# Patient Record
Sex: Male | Born: 1985 | Race: Black or African American | Hispanic: No | Marital: Single | Smoking: Current every day smoker
Health system: Southern US, Community
[De-identification: ages and names within clinical notes are randomized; demographics above are authoritative.]

## PROBLEM LIST (undated history)

## (undated) HISTORY — PX: KNEE ARTHROSCOPY: SUR90

---
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
## 2007-02-25 ENCOUNTER — Encounter: Payer: Self-pay | Admitting: *Deleted

## 2010-07-04 ENCOUNTER — Inpatient Hospital Stay (INDEPENDENT_AMBULATORY_CARE_PROVIDER_SITE_OTHER)
Admission: RE | Admit: 2010-07-04 | Discharge: 2010-07-04 | Disposition: A | Discharge status: Home / Self Care | Payer: BC Managed Care – PPO | Source: Ambulatory Visit | Attending: Family Medicine | Admitting: Family Medicine

## 2010-07-04 ENCOUNTER — Ambulatory Visit (INDEPENDENT_AMBULATORY_CARE_PROVIDER_SITE_OTHER): Payer: BC Managed Care – PPO

## 2010-07-04 DIAGNOSIS — M79609 Pain in unspecified limb: Secondary | ICD-10-CM

## 2010-07-21 NOTE — Op Note (Signed)
NAME:  RICHERD, GRIME                        ACCOUNT NO.:  [id number]   MEDICAL RECORD NO.:  [id number]                   PATIENT TYPE:  AMB   LOCATION:  DSC                                  FACILITY:  [facility]   PHYSICIAN:  Claude Manges. Cleophas Dunker, M.D.            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   POSTOPERATIVE DIAGNOSES:  Osteochondritis, distal ______ mediofemoral  condyle left knee.   PROCEDURE:  Drilling at mediofemoral condyle left knee.   SURGEON:  Claude Manges. Cleophas Dunker, M.D.   ASSISTANT:  Jamelle Rushing, P.A.   ANESTHESIA:  General.   COMPLICATIONS:  None.   HISTORY:  A 25 year old young man who had sudden onset of left knee pain  several months ago.  The patient was seen with pain along the medial  compartment with mild effusion.  Subsequently, he had an x-ray that revealed  an osteochondritis distal _________ lesion.  An MRI confirmed the above  without evidence of loose articular cartilage.  He continues to have some  dysfunction.  Now is to have arthroscopic evaluation.   PROCEDURE IN DETAIL:  The patient was placed on operating table under  general anesthesia.  The left lower extremity was placed in the thigh holds.  The leg was then prepped with Duraprep with thigh holds at the ankles.  Sterile draping was performed.   Marcaine with epinephrine was injected on either side of the patellar  tendon.  Three puncture sites were then made.  Diagnostic arthroscopy was  performed without evidence of effusion.  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  There was no  synovitis in the superior pouch.  There was a small medial shelf plica that  was asymptomatic.  The lateral compartment was free of meniscal pathology or  chondromalacia.  The  ACL appeared to be intact.  There is a negative  ____________.  There was an area along the mid and posterior portion of the  medial femoral condyle where the articular cartilage was a little spongy but  there were no obvious loose pieces or windows of articular cartilage _____  subchondral bone.  So I elected not to do any pinning as it was obviously  stable but I did perform multiple drill holes into this spongy area.  The  joint was then cleared of any loose material.  The two stab wounds were left  open and infiltrated 0.25% Marcaine with epinephrine.  Sterile bulky  dressing was applied followed by an Ace bandage.   PLAN:  Tylenol No.3 for pain.  Office one week.  Claude Manges. Cleophas Dunker, M.D.   PWW/MEDQ  D:  02/24/2002  T:  02/24/2002  Job:  [id number]

## 2011-08-25 ENCOUNTER — Encounter (HOSPITAL_COMMUNITY): Payer: Self-pay | Admitting: Emergency Medicine

## 2011-08-25 ENCOUNTER — Emergency Department (HOSPITAL_COMMUNITY)
Admission: EM | Admit: 2011-08-25 | Discharge: 2011-08-25 | Disposition: A | Discharge status: Home / Self Care | Payer: BC Managed Care – PPO | Attending: Emergency Medicine | Admitting: Emergency Medicine

## 2011-08-25 DIAGNOSIS — M25529 Pain in unspecified elbow: Secondary | ICD-10-CM | POA: Insufficient documentation

## 2011-08-25 DIAGNOSIS — Z23 Encounter for immunization: Secondary | ICD-10-CM | POA: Insufficient documentation

## 2011-08-25 DIAGNOSIS — IMO0002 Reserved for concepts with insufficient information to code with codable children: Secondary | ICD-10-CM | POA: Insufficient documentation

## 2011-08-25 MED ORDER — TETANUS-DIPHTH-ACELL PERTUSSIS 5-2.5-18.5 LF-MCG/0.5 IM SUSP
0.5000 mL | Freq: Once | INTRAMUSCULAR | Status: AC
  Administered 2011-08-25: 0.5 mL via INTRAMUSCULAR
  Filled 2011-08-25: qty 0.5

## 2011-08-25 MED ORDER — HYDROCODONE-ACETAMINOPHEN 5-325 MG PO TABS: 2.0000 | ORAL_TABLET | ORAL | Status: AC | PRN

## 2011-08-25 NOTE — ED Notes (Signed)
Pt. Abrasions cleaned and bacitracin applied.
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
## 2011-08-25 NOTE — ED Notes (Signed)
Pt. Has abrasion to front on neck with pain 10/10 and c/o of left elbow pain as well. CMS intact. LSB removed by Dr. Radford Pax.

## 2012-01-24 ENCOUNTER — Emergency Department (HOSPITAL_COMMUNITY)
Admission: EM | Admit: 2012-01-24 | Discharge: 2012-01-24 | Disposition: A | Discharge status: Home / Self Care | Payer: BC Managed Care – PPO | Source: Home / Self Care | Attending: Emergency Medicine | Admitting: Emergency Medicine

## 2012-01-24 ENCOUNTER — Encounter (HOSPITAL_COMMUNITY): Payer: Self-pay | Admitting: *Deleted

## 2012-01-24 DIAGNOSIS — R21 Rash and other nonspecific skin eruption: Secondary | ICD-10-CM

## 2012-01-24 MED ORDER — HYDROXYZINE HCL 25 MG PO TABS: 25.0000 mg | ORAL_TABLET | Freq: Four times a day (QID) | ORAL | Status: DC

## 2012-01-24 MED ORDER — PERMETHRIN 5 % EX CREA: TOPICAL_CREAM | CUTANEOUS | Status: DC

## 2012-01-24 NOTE — ED Notes (Signed)
Rash on both hands onset 2 weeks ago with itching.  Spread to legs, back and arms.

## 2012-01-24 NOTE — ED Provider Notes (Signed)
History     CSN: [id number]  Arrival date & time 01/24/12  1630   First MD Initiated Contact with Patient 01/24/12 1635      Chief Complaint  Patient presents with  . Rash    (Consider location/radiation/quality/duration/timing/severity/associated sxs/prior treatment) HPI Comments: Patient presents urgent care complaining of rash on both of his hands that had been going on for about 2 weeks it itches the most at night. It has been spreading to both back arms and legs he was reporting his girlfriend to have a similar rash on her abdomen"  Patient is a 26 y.o. male presenting with rash. The history is provided by the patient.  Rash  This is a new problem. The current episode started more than 1 week ago. The problem has not changed since onset.The problem is associated with nothing. There has been no fever. The rash is present on the trunk (Hands). The pain is at a severity of 2/10. Associated symptoms include itching. Pertinent negatives include no weeping. He has tried nothing for the symptoms. The treatment provided no relief.    History reviewed. No pertinent past medical history.  Past Surgical History  Procedure Date  . Knee arthroscopy     Family History  Problem Relation Age of Onset  . Diabetes Mother   . Hypertension Father     History  Substance Use Topics  . Smoking status: Current Every Day Smoker -- 0.2 packs/day    Types: Cigarettes  . Smokeless tobacco: Not on file  . Alcohol Use: No     Comment: opccasional      Review of Systems  Skin: Positive for itching and rash.    Allergies  Review of patient's allergies indicates no known allergies.  Home Medications   Current Outpatient Rx  Name  Route  Sig  Dispense  Refill  . HYDROXYZINE HCL 25 MG PO TABS   Oral   Take 1 tablet (25 mg total) by mouth every 6 (six) hours.   12 tablet   0   . PERMETHRIN 5 % EX CREA      Apply from head to toes leave on for about 10 hours. Repeat treatment IN 10  days   60 g   0     BP 107/66  Pulse 92  Temp 98.6 F (37 C) (Oral)  Resp 20  SpO2 100%  Physical Exam  Nursing note and vitals reviewed. Constitutional: Vital signs are normal. He appears well-developed and well-nourished.  Non-toxic appearance. He does not have a sickly appearance. He does not appear ill. No distress.  Neurological: He is alert.  Skin: Rash noted. No bruising, no ecchymosis, no laceration and no petechiae noted. Rash is papular. Rash is not macular and not maculopapular. There is erythema.       ED Course  Procedures (including critical care time)   Labs Reviewed  RPR   No results found.   1. Diffuse papular eruption    Suspicious for scabies. Positive close contact with similar pruritic rash   MDM  Papular abruption to both hands and interdigital spaces some of the palmar surface in the anterior abdomen predominantly reporting a girlfriend with a similar rash in her abdomen pruritic and with "bumps". Most suspicious for scabies. Will perform an RPR to rule out secondary syphilis as patient has some eruptions in different stages of healing on the palmar surface of his hands.        Jimmie Molly, MD 01/24/12 [phone]

## 2012-01-25 LAB — RPR: RPR Ser Ql: NONREACTIVE

## 2012-02-15 ENCOUNTER — Encounter (HOSPITAL_COMMUNITY): Payer: Self-pay | Admitting: *Deleted

## 2012-02-15 ENCOUNTER — Other Ambulatory Visit (HOSPITAL_COMMUNITY)
Admission: RE | Admit: 2012-02-15 | Discharge: 2012-02-15 | Disposition: A | Discharge status: Home / Self Care | Payer: BC Managed Care – PPO | Source: Ambulatory Visit | Attending: Family Medicine | Admitting: Family Medicine

## 2012-02-15 ENCOUNTER — Emergency Department (HOSPITAL_COMMUNITY)
Admission: EM | Admit: 2012-02-15 | Discharge: 2012-02-15 | Disposition: A | Discharge status: Home / Self Care | Payer: BC Managed Care – PPO | Source: Home / Self Care | Attending: Family Medicine | Admitting: Family Medicine

## 2012-02-15 DIAGNOSIS — Z113 Encounter for screening for infections with a predominantly sexual mode of transmission: Secondary | ICD-10-CM | POA: Insufficient documentation

## 2012-02-15 DIAGNOSIS — A64 Unspecified sexually transmitted disease: Secondary | ICD-10-CM

## 2012-02-15 MED ORDER — AZITHROMYCIN 250 MG PO TABS
1000.0000 mg | ORAL_TABLET | Freq: Once | ORAL | Status: AC
  Administered 2012-02-15: 1000 mg via ORAL

## 2012-02-15 MED ORDER — CEFTRIAXONE SODIUM 1 G IJ SOLR
250.0000 mg | Freq: Once | INTRAMUSCULAR | Status: AC
  Administered 2012-02-15: 250 mg via INTRAMUSCULAR

## 2012-02-15 MED ORDER — AZITHROMYCIN 250 MG PO TABS
ORAL_TABLET | ORAL | Status: AC
  Filled 2012-02-15: qty 4

## 2012-02-15 MED ORDER — CEFTRIAXONE SODIUM 250 MG IJ SOLR
INTRAMUSCULAR | Status: AC
  Filled 2012-02-15: qty 250

## 2012-02-15 NOTE — ED Notes (Signed)
Pt  Reports  Symptoms  Of  Penile  Discharge   X  sev  Days  With  Burning  Sensation

## 2012-02-15 NOTE — ED Provider Notes (Signed)
History     CSN: [id number]  Arrival date & time 02/15/12  1230   First MD Initiated Contact with Patient 02/15/12 1246      Chief Complaint  Patient presents with  . SEXUALLY TRANSMITTED DISEASE    (Consider location/radiation/quality/duration/timing/severity/associated sxs/prior treatment) Patient is a 26 y.o. male presenting with penile discharge. The history is provided by the patient.  Penile Discharge This is a new problem. The current episode started yesterday. The problem has been gradually worsening. Pertinent negatives include no abdominal pain.    History reviewed. No pertinent past medical history.  Past Surgical History  Procedure Date  . Knee arthroscopy     Family History  Problem Relation Age of Onset  . Diabetes Mother   . Hypertension Father     History  Substance Use Topics  . Smoking status: Current Every Day Smoker -- 0.2 packs/day    Types: Cigarettes  . Smokeless tobacco: Not on file  . Alcohol Use: No     Comment: opccasional      Review of Systems  Constitutional: Negative.   Gastrointestinal: Negative for abdominal pain.  Genitourinary: Positive for discharge. Negative for dysuria, penile swelling and penile pain.    Allergies  Review of patient's allergies indicates no known allergies.  Home Medications   Current Outpatient Rx  Name  Route  Sig  Dispense  Refill  . HYDROXYZINE HCL 25 MG PO TABS   Oral   Take 1 tablet (25 mg total) by mouth every 6 (six) hours.   12 tablet   0   . PERMETHRIN 5 % EX CREA      Apply from head to toes leave on for about 10 hours. Repeat treatment IN 10 days   60 g   0     BP 124/52  Pulse 76  Temp 98.4 F (36.9 C) (Oral)  Resp 16  SpO2 100%  Physical Exam  Nursing note and vitals reviewed. Constitutional: He is oriented to person, place, and time. He appears well-developed and well-nourished.  Abdominal: Soft. Bowel sounds are normal. He exhibits no distension and no mass. There  is no tenderness. There is no rebound and no guarding.  Genitourinary: No penile tenderness.  Neurological: He is alert and oriented to person, place, and time.  Skin: Skin is warm and dry.    ED Course  Procedures (including critical care time)   Labs Reviewed  URINE CYTOLOGY ANCILLARY ONLY   No results found.   1. STD (male)       MDM          Linna Hoff, MD 02/15/12 1318

## 2012-02-19 ENCOUNTER — Telehealth (HOSPITAL_COMMUNITY): Payer: Self-pay | Admitting: *Deleted

## 2012-02-19 NOTE — ED Notes (Signed)
GC pos., Chlamydia neg.  Pt. adequately treated with Rocephin and Zithromax.  I called and left pt. a message to call.  [facility] form completed and faxed to the [facility]. Vassie Moselle 02/19/2012

## 2012-02-22 ENCOUNTER — Telehealth (HOSPITAL_COMMUNITY): Payer: Self-pay | Admitting: *Deleted

## 2012-04-13 ENCOUNTER — Telehealth (HOSPITAL_COMMUNITY): Payer: Self-pay | Admitting: *Deleted

## 2012-04-13 NOTE — ED Notes (Signed)
1/2 Left message.  Call 3.  Unable to contact pt. by phone.  Letter sent. Juan Bauer 04/13/2012

## 2012-09-24 ENCOUNTER — Encounter (HOSPITAL_COMMUNITY): Payer: Self-pay | Admitting: Emergency Medicine

## 2012-09-24 ENCOUNTER — Emergency Department (HOSPITAL_COMMUNITY)
Admission: EM | Admit: 2012-09-24 | Discharge: 2012-09-24 | Disposition: A | Discharge status: Home / Self Care | Payer: BC Managed Care – PPO | Attending: Emergency Medicine | Admitting: Emergency Medicine

## 2012-09-24 ENCOUNTER — Emergency Department (HOSPITAL_COMMUNITY): Payer: BC Managed Care – PPO

## 2012-09-24 DIAGNOSIS — S62308A Unspecified fracture of other metacarpal bone, initial encounter for closed fracture: Secondary | ICD-10-CM

## 2012-09-24 DIAGNOSIS — Y939 Activity, unspecified: Secondary | ICD-10-CM | POA: Insufficient documentation

## 2012-09-24 DIAGNOSIS — Y929 Unspecified place or not applicable: Secondary | ICD-10-CM | POA: Insufficient documentation

## 2012-09-24 DIAGNOSIS — F17200 Nicotine dependence, unspecified, uncomplicated: Secondary | ICD-10-CM | POA: Insufficient documentation

## 2012-09-24 DIAGNOSIS — S62319A Displaced fracture of base of unspecified metacarpal bone, initial encounter for closed fracture: Secondary | ICD-10-CM | POA: Insufficient documentation

## 2012-09-24 MED ORDER — IBUPROFEN 400 MG PO TABS
400.0000 mg | ORAL_TABLET | Freq: Once | ORAL | Status: AC
  Administered 2012-09-24: 400 mg via ORAL
  Filled 2012-09-24: qty 1

## 2012-09-24 MED ORDER — PROMETHAZINE HCL 25 MG PO TABS: 25.0000 mg | ORAL_TABLET | Freq: Four times a day (QID) | ORAL | Status: DC | PRN

## 2012-09-24 MED ORDER — HYDROCODONE-ACETAMINOPHEN 5-325 MG PO TABS: 2.0000 | ORAL_TABLET | Freq: Four times a day (QID) | ORAL | Status: DC | PRN

## 2012-09-24 NOTE — ED Notes (Signed)
Ortho at bedside.

## 2012-09-24 NOTE — ED Provider Notes (Signed)
History    CSN: [id number] Arrival date & time 09/24/12  0902  First MD Initiated Contact with Patient 09/24/12 [phone]     Chief Complaint  Patient presents with  . Hand Injury    Right hand   (Consider location/radiation/quality/duration/timing/severity/associated sxs/prior Treatment) HPI Comments: Patient is a 27 year old male who presents today with hand pain to his right hand. He got his hand caught in a door and a [place]ena[place] Niece and it crushed his hand. He has iced it since last night which helped with the pain and swelling. He describes the pain as a pressure. He can localize the pain over his 4th metacarpal. It is worse with movement. No numbness, tingling, weakness. He is right-handed. He has never injured this hand in the past.  Patient is a 27 y.o. male presenting with hand injury. The history is provided by the patient. No language interpreter was used.  Hand Injury Associated symptoms: no fever    History reviewed. No pertinent past medical history. Past Surgical History  Procedure Laterality Date  . Knee arthroscopy     Family History  Problem Relation Age of Onset  . Diabetes Mother   . Hypertension Father    History  Substance Use Topics  . Smoking status: Current Every Day Smoker -- 0.20 packs/day    Types: Cigarettes  . Smokeless tobacco: Not on file  . Alcohol Use: No     Comment: opccasional    Review of Systems  Constitutional: Negative for fever and chills.  Gastrointestinal: Negative for nausea, vomiting and abdominal pain.  Musculoskeletal: Positive for joint swelling and arthralgias.  All other systems reviewed and are negative.    Allergies  Review of patient's allergies indicates no known allergies.  Home Medications   Current Outpatient Rx  Name  Route  Sig  Dispense  Refill  . hydrOXYzine (ATARAX/VISTARIL) 25 MG tablet   Oral   Take 1 tablet (25 mg total) by mouth every 6 (six) hours.   12 tablet   0   . permethrin (ELIMITE) 5 % cream     Apply from head to toes leave on for about 10 hours. Repeat treatment IN 10 days   60 g   0    BP 124/78  Pulse 76  Temp(Src) 97 F (36.1 C) (Oral)  Resp 18  Ht 5\' 9"  (1.753 m)  Wt 215 lb (97.523 kg)  BMI 31.74 kg/m2  SpO2 98% Physical Exam  Nursing note and vitals reviewed. Constitutional: He is oriented to person, place, and time. He appears well-developed and well-nourished. No distress.  HENT:  Head: Normocephalic and atraumatic.  Right Ear: External ear normal.  Left Ear: External ear normal.  Nose: Nose normal.  Eyes: Conjunctivae are normal.  Neck: Normal range of motion. No tracheal deviation present.  Cardiovascular: Normal rate, regular rhythm, normal heart sounds, intact distal pulses and normal pulses.   Pulmonary/Chest: Effort normal and breath sounds normal. No stridor.  Abdominal: Soft. He exhibits no distension. There is no tenderness.  Musculoskeletal:       Right hand: He exhibits decreased range of motion, tenderness, bony tenderness and swelling. He exhibits normal two-point discrimination, normal capillary refill, no deformity and no laceration. Normal sensation noted.       Hands: Neurological: He is alert and oriented to person, place, and time.  Neurovascularly intact  Skin: Skin is warm and dry. He is not diaphoretic.  Psychiatric: He has a normal mood and affect. His behavior is  normal.    ED Course  Procedures (including critical care time) Labs Reviewed - No data to display Dg Hand Complete Right  09/24/2012   *RADIOLOGY REPORT*  Clinical Data: Pain post trauma  RIGHT HAND - COMPLETE 3+ VIEW  Comparison: None.  Findings:  Frontal, oblique, and lateral views were obtained. There is a comminuted fracture of the distal fifth metacarpal with fracture fragments in overall near anatomic alignment.  No other fractures.  No dislocation.  Joint spaces appear intact.  No erosive change.  IMPRESSION: Comminuted fracture distal fifth metacarpal.   Original  Report Authenticated By: Bretta Bang, M.D.   1. Closed fracture of 5th metacarpal, initial encounter     MDM  Patient presents with a comminuted fracture of his distal fifth metacarpal. He was placed in an ulnar gutter splint and told to follow up with ortho. Neurovascularly intact. Compartment soft. He also requested a PCP and a resource guide was given. Discussed rest, ice, elevation. Pain med rx given. Return instructions given. Vital signs stable for discharge. Patient / Family / Caregiver informed of clinical course, understand medical decision-making process, and agree with plan.   Mora Bellman, PA-C 09/24/12 1642

## 2012-09-24 NOTE — Progress Notes (Signed)
Orthopedic Tech Progress Note Patient Details:  Juan Bauer March 31, 1985 [id number]  Ortho Devices Type of Ortho Device: Ulna gutter splint Ortho Device/Splint Interventions: Application   Shawnie Pons 09/24/2012, 10:05 AM

## 2012-09-24 NOTE — ED Provider Notes (Signed)
Medical screening examination/treatment/procedure(s) were performed by non-physician practitioner and as supervising physician I was immediately available for consultation/collaboration.   Glynn Octave, MD 09/24/12 1719

## 2012-09-24 NOTE — ED Notes (Signed)
PT ambulated with baseline gait; VSS; A&Ox3; no signs of distress; respirations even and unlabored; skin warm and dry; no questions upon discharge.  

## 2012-09-24 NOTE — ED Notes (Signed)
Pt reports smashed right hand in door yesterday. Pt presents with swelling to right hand.

## 2012-09-26 ENCOUNTER — Emergency Department (HOSPITAL_COMMUNITY)
Admission: EM | Admit: 2012-09-26 | Discharge: 2012-09-26 | Disposition: A | Discharge status: Home / Self Care | Payer: BC Managed Care – PPO | Attending: Emergency Medicine | Admitting: Emergency Medicine

## 2012-09-26 ENCOUNTER — Encounter (HOSPITAL_COMMUNITY): Payer: Self-pay | Admitting: Emergency Medicine

## 2012-09-26 DIAGNOSIS — R209 Unspecified disturbances of skin sensation: Secondary | ICD-10-CM | POA: Insufficient documentation

## 2012-09-26 DIAGNOSIS — Y929 Unspecified place or not applicable: Secondary | ICD-10-CM | POA: Insufficient documentation

## 2012-09-26 DIAGNOSIS — F17200 Nicotine dependence, unspecified, uncomplicated: Secondary | ICD-10-CM | POA: Insufficient documentation

## 2012-09-26 DIAGNOSIS — M25549 Pain in joints of unspecified hand: Secondary | ICD-10-CM | POA: Insufficient documentation

## 2012-09-26 DIAGNOSIS — Y939 Activity, unspecified: Secondary | ICD-10-CM | POA: Insufficient documentation

## 2012-09-26 DIAGNOSIS — S62309S Unspecified fracture of unspecified metacarpal bone, sequela: Secondary | ICD-10-CM

## 2012-09-26 DIAGNOSIS — X58XXXA Exposure to other specified factors, initial encounter: Secondary | ICD-10-CM | POA: Insufficient documentation

## 2012-09-26 DIAGNOSIS — Z4689 Encounter for fitting and adjustment of other specified devices: Secondary | ICD-10-CM | POA: Insufficient documentation

## 2012-09-26 DIAGNOSIS — S42309S Unspecified fracture of shaft of humerus, unspecified arm, sequela: Secondary | ICD-10-CM | POA: Insufficient documentation

## 2012-09-26 NOTE — ED Notes (Addendum)
UGS removed. Pt states "feels better". Good cap refill, color pink.

## 2012-09-26 NOTE — ED Provider Notes (Signed)
  CSN: [id number]     Arrival date & time 09/26/12  1112 History     First MD Initiated Contact with Patient 09/26/12 1228     Chief Complaint  Patient presents with  . Hand Injury   (Consider location/radiation/quality/duration/timing/severity/associated sxs/prior Treatment) HPI Comments: Patient presenting with throbbing pain of his right hand.  He also reports that his right 5th digit feels numb.  He was seen in the ED two days ago and was diagnosed with a Boxer's Fracture and placed in an Ulnar Gutter splint.  He feels like the splint is too tight.  He has been taking Norco for the pain, which gives him some relief.    The history is provided by the patient.    History reviewed. No pertinent past medical history. Past Surgical History  Procedure Laterality Date  . Knee arthroscopy     Family History  Problem Relation Age of Onset  . Diabetes Mother   . Hypertension Father    History  Substance Use Topics  . Smoking status: Current Every Day Smoker -- 0.20 packs/day    Types: Cigarettes  . Smokeless tobacco: Not on file  . Alcohol Use: No     Comment: opccasional    Review of Systems  Musculoskeletal:       Right hand pain  Neurological: Positive for numbness.    Allergies  Review of patient's allergies indicates no known allergies.  Home Medications   Current Outpatient Rx  Name  Route  Sig  Dispense  Refill  . HYDROcodone-acetaminophen (NORCO/VICODIN) 5-325 MG per tablet   Oral   Take 2 tablets by mouth every 6 (six) hours as needed for pain.   12 tablet   0   . promethazine (PHENERGAN) 25 MG tablet   Oral   Take 1 tablet (25 mg total) by mouth every 6 (six) hours as needed for nausea.   12 tablet   0    BP 132/89  Pulse 82  Temp(Src) 97.6 F (36.4 C) (Oral)  Resp 20  SpO2 98% Physical Exam  Nursing note and vitals reviewed. Constitutional: He appears well-developed and well-nourished.  HENT:  Head: Normocephalic and atraumatic.   Cardiovascular: Normal rate, regular rhythm, normal heart sounds and intact distal pulses.   Pulses:      Radial pulses are 2+ on the right side, and 2+ on the left side.  Pulmonary/Chest: Effort normal and breath sounds normal.  Musculoskeletal:  Splint removed by nursing staff prior to evaluation.  Mild swelling over the dorsal aspect of the right hand over the 4th and 5th metacarpal.    Neurological: He is alert. No sensory deficit.  Distal sensation of the fingers of the right hand intact.  Skin: Skin is warm, dry and intact. No erythema.  Good capillary refill of all fingers of the right hand.  Psychiatric: He has a normal mood and affect.    ED Course   Procedures (including critical care time)  Labs Reviewed - No data to display No results found. No diagnosis found.  MDM  Patient diagnosed with Boxer's Fracture of the right hand 2 days ago.  Today he presents with throbbing pain and numbness of the 5th digit.  Splint removed in the ED and reapplied.  Symptoms improved after splint reapplied.    Pascal Lux [place], PA-C 09/27/12 1953

## 2012-09-26 NOTE — ED Notes (Signed)
Pt was seen here on 7/22 for right 5th  metacarpal fx. Pt has ulnar gutter splint in place. C/o numbness and throbbing to right hand and 5th finger.

## 2012-09-30 NOTE — ED Provider Notes (Signed)
Medical screening examination/treatment/procedure(s) were performed by non-physician practitioner and as supervising physician I was immediately available for consultation/collaboration.   Nilda Keathley Ann Reneta Niehaus, MD 09/30/12 0714 

## 2013-09-17 ENCOUNTER — Encounter (HOSPITAL_COMMUNITY): Payer: Self-pay | Admitting: Emergency Medicine

## 2013-09-17 ENCOUNTER — Emergency Department (INDEPENDENT_AMBULATORY_CARE_PROVIDER_SITE_OTHER)
Admission: EM | Admit: 2013-09-17 | Discharge: 2013-09-17 | Disposition: A | Discharge status: Home / Self Care | Payer: Self-pay | Source: Home / Self Care

## 2013-09-17 DIAGNOSIS — Z202 Contact with and (suspected) exposure to infections with a predominantly sexual mode of transmission: Secondary | ICD-10-CM

## 2013-09-17 DIAGNOSIS — R369 Urethral discharge, unspecified: Secondary | ICD-10-CM

## 2013-09-17 LAB — POCT URINALYSIS DIP (DEVICE)
GLUCOSE, UA: NEGATIVE mg/dL
HGB URINE DIPSTICK: NEGATIVE
KETONES UR: NEGATIVE mg/dL
Leukocytes, UA: NEGATIVE
Nitrite: NEGATIVE
Protein, ur: 30 mg/dL — AB
Urobilinogen, UA: 1 mg/dL (ref 0.0–1.0)
pH: 6 (ref 5.0–8.0)

## 2013-09-17 MED ORDER — CEFTRIAXONE SODIUM 250 MG IJ SOLR
INTRAMUSCULAR | Status: AC
  Filled 2013-09-17: qty 250

## 2013-09-17 MED ORDER — AZITHROMYCIN 250 MG PO TABS
1000.0000 mg | ORAL_TABLET | Freq: Once | ORAL | Status: AC
  Administered 2013-09-17: 1000 mg via ORAL

## 2013-09-17 MED ORDER — LIDOCAINE HCL (PF) 1 % IJ SOLN
INTRAMUSCULAR | Status: AC
  Filled 2013-09-17: qty 5

## 2013-09-17 MED ORDER — AZITHROMYCIN 250 MG PO TABS
ORAL_TABLET | ORAL | Status: AC
  Filled 2013-09-17: qty 4

## 2013-09-17 MED ORDER — CEFTRIAXONE SODIUM 250 MG IJ SOLR
250.0000 mg | Freq: Once | INTRAMUSCULAR | Status: AC
  Administered 2013-09-17: 250 mg via INTRAMUSCULAR

## 2013-09-17 NOTE — ED Provider Notes (Signed)
CSN: [id number]     Arrival date & time 09/17/13  1522 History   None    Chief Complaint  Patient presents with  . Exposure to STD   (Consider location/radiation/quality/duration/timing/severity/associated sxs/prior Treatment) HPI  Penile discharge: started 4 wks ago. Dysuria. Denies penile lesions, fevers, adenopathy. No change in past 4 wks. H/o STD in past. Sexually active but not protecting. Pt and partner were both previously Dx w/ Gonorrhea but partner did not get full treatment. Sexual partner w/o any symptoms at this time.   History reviewed. No pertinent past medical history. Past Surgical History  Procedure Laterality Date  . Knee arthroscopy     Family History  Problem Relation Age of Onset  . Diabetes Mother   . Hypertension Father    History  Substance Use Topics  . Smoking status: Current Every Day Smoker -- 0.20 packs/day    Types: Cigarettes  . Smokeless tobacco: Not on file  . Alcohol Use: No     Comment: opccasional    Review of Systems  Allergies  Review of patient's allergies indicates no known allergies.  Home Medications   Prior to Admission medications   Medication Sig Start Date End Date Taking? Authorizing Provider  HYDROcodone-acetaminophen (NORCO/VICODIN) 5-325 MG per tablet Take 2 tablets by mouth every 6 (six) hours as needed for pain. 09/24/12   Mora BellmanHannah S Lonnie Reth, PA-C  promethazine (PHENERGAN) 25 MG tablet Take 1 tablet (25 mg total) by mouth every 6 (six) hours as needed for nausea. 09/24/12   Mora BellmanHannah S Nakeitha Milligan, PA-C   BP 136/85  Pulse 73  Temp(Src) 98.5 F (36.9 C) (Oral)  Resp 16  SpO2 100% Physical Exam  Constitutional: He appears well-developed and well-nourished. No distress.  HENT:  Head: Normocephalic and atraumatic.  Eyes: EOM are normal. Pupils are equal, round, and reactive to light.  Neck: Normal range of motion.  Cardiovascular: Normal rate, regular rhythm, normal heart sounds and intact distal pulses.  Exam reveals no  gallop and no friction rub.   No murmur heard. Pulmonary/Chest: Effort normal. No respiratory distress.  Abdominal: Soft. He exhibits no distension.  Musculoskeletal: Normal range of motion. He exhibits no edema and no tenderness.  Neurological: He is alert. He exhibits normal muscle tone.  Skin: Skin is warm. No rash noted. He is not diaphoretic. No pallor.  Psychiatric: He has a normal mood and affect. His behavior is normal. Judgment and thought content normal.    ED Course  Procedures (including critical care time) Labs Review Labs Reviewed  POCT URINALYSIS DIP (DEVICE) - Abnormal; Notable for the following:    Bilirubin Urine SMALL (*)    Protein, ur 30 (*)    All other components within normal limits  HIV ANTIBODY (ROUTINE TESTING)  RPR    Imaging Review No results found.   MDM   1. STD exposure   2. 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 CTX adn Azithro in office. Safe sex practices discussed. Pt expressed understanding and will have sexual partner seek treatment as well Precautions given and all questions answered   Shelly Flattenavid Jaeceon Michelin, MD Family Medicine 09/17/2013, 4:05 PM      Ozella Rocksavid J Johnn Krasowski, MD 09/17/13 [phone]

## 2013-09-17 NOTE — Discharge Instructions (Signed)
You likely were reinfected with an STD.  This was completely treated in our office today Please do not have unprotected intercourse until your partner is treated completely  Gonorrhea Gonorrhea is an infection that can cause serious problems. If left untreated, may   Damage the male or male organs.   Cause women to be unable to have children (sterility).   Harm a fetus, if the infected woman is pregnant.  It is important to get treatment for gonorrhea as soon as possible. It is also necessary that all your sexual partners be tested for the infection.  CAUSES  Gonorrhea is caused by bacteria called Neisseria gonorrhoeae. The infection is spread from person to person, usually by sexual contact (such as by anal, vaginal, or oral means). A newborn can contract the infection from his or her mother during birth.  SYMPTOMS  Some people with gonorrhea do not have symptoms. Symptoms may be different in females and males.  Females The most common symptoms are:   Pain in the lower abdomen.   Fever with or without chills.  Other symptoms include:   Abnormal vaginal discharge.   Painful intercourse.   Burning or itching of the vagina or lips of the vagina.   Abnormal vaginal bleeding.   Pain when urinating.   Long-lasting (chronic) pain in the lower abdomen, especially during menstruation or intercourse.   Inability to become pregnant.   Going into premature labor.   Irritation, pain, bleeding, or discharge from the rectum. This may occur if the infection was spread by anal sex.   Sore throat or swollen neck lymph nodes. This may occur if the infection was spread by oral sex.  Males The most common symptoms are:   Discharge from the penis.   Pain or burning during urination.   Pain or swelling in the testicles. Other symptoms may include:   Irritation, pain, bleeding, or discharge from the rectum. This may occur if the infection was spread by anal sex.    Sore throat, fever, or swollen neck lymph nodes. This may occur if the infection was spread by oral sex.  DIAGNOSIS  A diagnosis is made after a physical exam is done and a sample of discharge is examined under a microscope for the presence of the bacteria. The discharge may be taken from the urethra, cervix, throat, or rectum.  TREATMENT  Gonorrhea is treated with antibiotic medicines. It is important for treatment to begin as soon as possible. Early treatment may prevent some problems from developing.  HOME CARE INSTRUCTIONS   Only take over-the-counter or prescription medicines for pain, fever, or discomfort as directed by your health care provider.   Take antibiotics as directed. Make sure you finish them even if you start to feel better. Incomplete treatment will put you at risk for continued infection.   Do not have sex until treatment is complete or as directed by your health care provider.   Follow up with your health care provider as directed.   Not all test results are available during your visit. If your test results are not back during the visit, make an appointment with your health care provider to find out the results. Do not assume everything is normal if you have not heard from your health care provider or the medical facility. It is important for you to follow up on all of your test results.   If you test positive for gonorrhea, inform your recent sexual partners. They need to be checked for gonorrhea  even if they do not have symptoms. They may need treatment, even if they test negative for gonorrhea.  SEEK MEDICAL CARE IF:   You develop any bad reaction to the medicine you were prescribed. This may include:   A rash.   Nausea.   Vomiting.   Diarrhea.   Your symptoms do not improve after a few days of taking antibiotics.   Your symptoms get worse.   You develop increased pain, such as in the testicles (for males) or in the abdomen (for females).   SEEK IMMEDIATE MEDICAL CARE IF:  You have a fever or persistent symptoms for more than 2-3 days.   You have a fever and your symptoms suddenly get worse.  MAKE SURE YOU:   Understand these instructions.  Will watch your condition.  Will get help right away if you are not doing well or get worse. Document Released: 02/17/2000 Document Revised: 12/10/2012 Document Reviewed: 08/27/2012 [facility] Patient Information 2015 [employer], [place]. This information is not intended to replace advice given to you by your health care provider. Make sure you discuss any questions you have with your health care provider.

## 2013-09-17 NOTE — ED Notes (Signed)
Pt would like to be tested for STD C/o penile d/c onset 1 month Denies urinary sx, fevers, abd pain Alert w/no signs of acute distress.

## 2013-09-18 LAB — RPR

## 2013-09-18 LAB — HIV ANTIBODY (ROUTINE TESTING W REFLEX): HIV 1&2 Ab, 4th Generation: NONREACTIVE

## 2016-10-10 ENCOUNTER — Ambulatory Visit (HOSPITAL_COMMUNITY)
Admission: EM | Admit: 2016-10-10 | Discharge: 2016-10-10 | Disposition: A | Discharge status: Home / Self Care | Payer: BC Managed Care – PPO | Attending: Internal Medicine | Admitting: Internal Medicine

## 2016-10-10 ENCOUNTER — Encounter (HOSPITAL_COMMUNITY): Payer: Self-pay | Admitting: Family Medicine

## 2016-10-10 ENCOUNTER — Ambulatory Visit (INDEPENDENT_AMBULATORY_CARE_PROVIDER_SITE_OTHER): Payer: BC Managed Care – PPO

## 2016-10-10 DIAGNOSIS — S63501A Unspecified sprain of right wrist, initial encounter: Secondary | ICD-10-CM

## 2016-10-10 DIAGNOSIS — W19XXXA Unspecified fall, initial encounter: Secondary | ICD-10-CM

## 2016-10-10 DIAGNOSIS — S60221A Contusion of right hand, initial encounter: Secondary | ICD-10-CM

## 2016-10-10 NOTE — Discharge Instructions (Signed)
Apply ice to the areas of the hand and wrist which are tender and painful. For the wrist immobilizer for the next 3-4 days. Keep elevated for any swelling. No areas demonstrating a fracture or dislocation were seen on the x-ray.

## 2016-10-10 NOTE — ED Provider Notes (Signed)
[facility]    CSN: [id number] Arrival date & time: 10/10/16  1115     History   Chief Complaint Chief Complaint  Patient presents with  . Hand Injury    HPI Juan Bauer is a 31 y.o. male.   31 year old male states he was running in the rain yesterday, slipped and fell landing on his right hand. He is complaining of pain along the the last SPECT of the hand as well as the radial portion of the wrist. Denies other injury.      History reviewed. No pertinent past medical history.  Patient Active Problem List   Diagnosis Date Noted  . MENTAL RETARDATION 05/02/2006  . 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 KNEE ARTHROSCOPY         Home Medications    Prior to Admission medications   Not on File    Family History Family History  Problem Relation Age of Onset  . Diabetes Mother   . Hypertension Father     Social History Social History  Substance Use Topics  . Smoking status: Current Every Day Smoker    Packs/day: 0.20    Types: Cigarettes  . Smokeless tobacco: Not on file  . Alcohol use No     Comment: opccasional     Allergies   Patient has no known allergies.   Review of Systems Review of Systems  Constitutional: Negative.   Respiratory: Negative.   Gastrointestinal: Negative.   Genitourinary: Negative.   Musculoskeletal:       As per HPI.   Skin: Negative.   Neurological: Negative for dizziness, weakness, numbness and headaches.  All other systems reviewed and are negative.    Physical Exam Triage Vital Signs ED Triage Vitals  Enc Vitals Group     BP 10/10/16 1202 (!) 118/56     Pulse Rate 10/10/16 1202 68     Resp 10/10/16 1202 18     Temp 10/10/16 1202 98.6 F (37 C)     Temp src --      SpO2 10/10/16 1202 100 %     Weight --      Height --      Head Circumference --      Peak Flow --      Pain Score 10/10/16 1200 8     Pain Loc --      Pain Edu? --    Excl. in GC? --    No data found.   Updated Vital Signs BP (!) 118/56   Pulse 68   Temp 98.6 F (37 C)   Resp 18   SpO2 100%   Visual Acuity Right Eye Distance:   Left Eye Distance:   Bilateral Distance:    Right Eye Near:   Left Eye Near:    Bilateral Near:     Physical Exam  Constitutional: He is oriented to person, place, and time. He appears well-developed and well-nourished.  HENT:  Head: Normocephalic and atraumatic.  Eyes: EOM are normal. Left eye exhibits no discharge.  Neck: Neck supple.  Pulmonary/Chest: Effort normal.  Musculoskeletal: Normal range of motion. He exhibits no edema or deformity.  Minimal tenderness over the navicular. Positive Finkelstein sign with tenderness along the extensor pollicis longus. No swelling. Full range of motion of the digits. Mild tenderness over the fourth and fifth metacarpals. No edema or deformities. Normal warmth and color. Full range of motion of the wrist.  Able to make a complete fist with flexion and normal extension. Distal neurovascular motor sensory is intact.  Neurological: He is alert and oriented to person, place, and time. No cranial nerve deficit.  Skin: Skin is warm and dry.  Psychiatric: He has a normal mood and affect.  Nursing note and vitals reviewed.    UC Treatments / Results  Labs (all labs ordered are listed, but only abnormal results are displayed) Labs Reviewed - No data to display  EKG  EKG Interpretation None       Radiology Dg Hand Complete Right  Result Date: 10/10/2016 CLINICAL DATA:  Slipped and fell last night on a wet concrete surface and caught self with right hand. Rt hand impacted medial aspect. Pain focused around 5th metacarpal. Pt reports minor pain in wrist when twisting hand. EXAM: RIGHT HAND - COMPLETE 3+ VIEW COMPARISON:  09/24/2012 FINDINGS: There is remote fracture of the fifth metacarpal head. No acute fracture or subluxation identified. No radiopaque foreign body or soft  tissue gas. IMPRESSION: No evidence for acute  abnormality. Electronically Signed   By: Norva PavlovElizabeth  Brown M.D.   On: 10/10/2016 12:45    Procedures Procedures (including critical care time)  Medications Ordered in UC Medications - No data to display   Initial Impression / Assessment and Plan / UC Course  I have reviewed the triage vital signs and the nursing notes.  Pertinent labs & imaging results that were available during my care of the patient were reviewed by me and considered in my medical decision making (see chart for details).     Apply ice to the areas of the hand and wrist which are tender and painful. For the wrist immobilizer for the next 3-4 days. Keep elevated for any swelling. No areas demonstrating a fracture or dislocation were seen on the x-ray.   Final Clinical Impressions(s) / UC Diagnoses   Final diagnoses:  Contusion of right hand, initial encounter  Wrist sprain, right, initial encounter    New Prescriptions Current Discharge Medication List       Controlled Substance Prescriptions [place] Controlled Substance Registry consulted? Elba BarmanN/A   Aahana Elza, NP 10/10/16 1258

## 2016-10-10 NOTE — ED Triage Notes (Signed)
Pt here for ijury to right hand after a fall last night and landing on hand.

## 2018-04-26 ENCOUNTER — Encounter (HOSPITAL_COMMUNITY): Payer: Self-pay | Admitting: Emergency Medicine

## 2018-04-26 ENCOUNTER — Emergency Department (HOSPITAL_COMMUNITY): Payer: 59

## 2018-04-26 ENCOUNTER — Emergency Department (HOSPITAL_COMMUNITY)
Admission: EM | Admit: 2018-04-26 | Discharge: 2018-04-26 | Disposition: A | Discharge status: Home / Self Care | Payer: 59 | Attending: Emergency Medicine | Admitting: Emergency Medicine

## 2018-04-26 DIAGNOSIS — Y929 Unspecified place or not applicable: Secondary | ICD-10-CM | POA: Insufficient documentation

## 2018-04-26 DIAGNOSIS — F17210 Nicotine dependence, cigarettes, uncomplicated: Secondary | ICD-10-CM | POA: Diagnosis not present

## 2018-04-26 DIAGNOSIS — S93601A Unspecified sprain of right foot, initial encounter: Secondary | ICD-10-CM | POA: Diagnosis not present

## 2018-04-26 DIAGNOSIS — X501XXA Overexertion from prolonged static or awkward postures, initial encounter: Secondary | ICD-10-CM | POA: Insufficient documentation

## 2018-04-26 DIAGNOSIS — Y999 Unspecified external cause status: Secondary | ICD-10-CM | POA: Diagnosis not present

## 2018-04-26 DIAGNOSIS — Y9367 Activity, basketball: Secondary | ICD-10-CM | POA: Diagnosis not present

## 2018-04-26 DIAGNOSIS — S99921A Unspecified injury of right foot, initial encounter: Secondary | ICD-10-CM | POA: Diagnosis present

## 2018-04-26 NOTE — ED Triage Notes (Signed)
Pt reports he played basketball yesterday and reports that having pains in right foot since. Reports worse with bearing weight.

## 2018-04-26 NOTE — Discharge Instructions (Signed)
Wear post-op shoe and Use crutches as needed for comfort. Ice and elevate foot throughout the day, using ice pack for no more than 20 minutes every hour.  Alternate between tylenol and ibuprofen as needed for pain. Follow up with the orthopedist in 1-2 weeks for recheck of symptoms. Return to the ER for changes or worsening symptoms.

## 2018-04-26 NOTE — ED Provider Notes (Signed)
[facility]EM[facility]GENCY [facility]PT Provider Note   CSN: [id number] Arrival date & time: 04/26/18  1630    History   Chief Complaint Chief Complaint  Patient presents with  . Foot Pain    HPI    Juan Bauer is a 33 y.o. male who presents to the ED with complaints of right foot pain that began last night.  Patient states that he was playing basketball and he was coming up on his toes and he heard a pop and started having pain on the lateral aspect of his right foot.  He describes the pain as 10/10 at worst but currently 2/10 constant sharp nonradiating lateral right foot pain that worsens with standing or bearing weight and has been unrelieved with Tylenol.  He reports some mild tingling in the toes as well.  He denies any swelling, bruising, numbness, focal weakness, or any other complaints at this time.  He does not have an orthopedist that he sees.  The history is provided by the patient and medical records. No language interpreter was used.  Foot Pain     History reviewed. No pertinent past medical history.  Patient Active Problem List   Diagnosis Date Noted  . MENTAL RETARDATION 05/02/2006  . 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 KNEE ARTHROSCOPY          Home Medications    Prior to Admission medications   Not on File    Family History Family History  Problem Relation Age of Onset  . Diabetes Mother   . Hypertension Father     Social History Social History   Tobacco Use  . Smoking status: Current Every Day Smoker    Packs/day: 0.20    Types: Cigarettes  . Smokeless tobacco: Never Used  Substance Use Topics  . Alcohol use: No    Comment: opccasional  . Drug use: Yes    Frequency: 7.0 times per week    Types: Marijuana     Allergies   Patient has no known allergies.   Review of Systems Review of Systems  Musculoskeletal: Positive for arthralgias. Negative for joint swelling.    Skin: Negative for color change.  Allergic/Immunologic: Negative for immunocompromised state.  Neurological: Negative for weakness and numbness.     Physical Exam Updated Vital Signs BP 123/80 (BP Location: Left Arm)   Pulse 80   Temp 98.5 F (36.9 C) (Oral)   Resp 19   SpO2 98%   Physical Exam Vitals signs and nursing note reviewed.  Constitutional:      General: He is not in acute distress.    Appearance: Normal appearance. He is well-developed. He is not toxic-appearing.     Comments: Afebrile, nontoxic, NAD  HENT:     Head: Normocephalic and atraumatic.  Eyes:     General:        Right eye: No discharge.        Left eye: No discharge.     Conjunctiva/sclera: Conjunctivae normal.  Neck:     Musculoskeletal: Normal range of motion and neck supple.  Cardiovascular:     Rate and Rhythm: Normal rate.     Pulses: Normal pulses.  Pulmonary:     Effort: Pulmonary effort is normal. No respiratory distress.  Abdominal:     General: There is no distension.  Musculoskeletal: Normal range of motion.     Right foot: Normal range of motion and normal capillary refill. Tenderness  and bony tenderness present. No swelling, crepitus, deformity or laceration.       Feet:     Comments: R foot with mild TTP to the base of the 5th metatarsal region, no bruising or swelling, no crepitus or deformity, no other areas of tenderness to remainder of foot or ankle, FROM intact in all toes and at the ankle, no overlying skin changes, strength and sensation grossly intact, distal pulses intact, soft compartments.   Skin:    General: Skin is warm and dry.     Findings: No rash.  Neurological:     Mental Status: He is alert and oriented to person, place, and time.     Sensory: Sensation is intact. No sensory deficit.     Motor: Motor function is intact.  Psychiatric:        Mood and Affect: Mood and affect normal.        Behavior: Behavior normal.      ED Treatments / Results  Labs (all  labs ordered are listed, but only abnormal results are displayed) Labs Reviewed - No data to display  EKG None  Radiology Dg Foot Complete Right  Result Date: 04/26/2018 CLINICAL DATA:  Basketball injury.  Mid right foot pain. EXAM: RIGHT FOOT COMPLETE - 3+ VIEW COMPARISON:  None. FINDINGS: Plantar calcaneal spur. No acute bony abnormality. Specifically, no fracture, subluxation, or dislocation. IMPRESSION: No acute bony abnormality. Electronically Signed   By: Charlett Nose M.D.   On: 04/26/2018 17:08    Procedures Procedures (including critical care time)  Medications Ordered in ED Medications - No data to display   Initial Impression / Assessment and Plan / ED Course  I have reviewed the triage vital signs and the nursing notes.  Pertinent labs & imaging results that were available during my care of the patient were reviewed by me and considered in my medical decision making (see chart for details).        33 y.o. male here with right foot pain after injuring it yesterday playing basketball.  On exam, mild tenderness to the base of the fifth metatarsal, no other areas of tenderness, no bruising or swelling, neurovascularly intact with soft compartments, wiggles all toes without difficulty.  X-ray reveals plantar calcaneal spur but otherwise no acute bony abnormalities.  Likely foot sprain, will give postop shoe and crutches for comfort.  Advised RICE, Tylenol, ibuprofen for pain, and follow-up with orthopedist in 1 to 2 weeks.  I explained the diagnosis and have given explicit precautions to return to the ER including for any other new or worsening symptoms. The patient understands and accepts the medical plan as it's been dictated and I have answered their questions. Discharge instructions concerning home care and prescriptions have been given. The patient is STABLE and is discharged to home in good condition.    Final Clinical Impressions(s) / ED Diagnoses   Final diagnoses:    Sprain of right foot, initial encounter    ED Discharge Orders    [address], [place], [place] 04/26/18 1759    Shaune Pollack, MD 04/30/18 0002

## 2019-05-09 ENCOUNTER — Ambulatory Visit: Payer: Self-pay

## 2020-05-17 IMAGING — CR DG FOOT COMPLETE 3+V*R*
3 series · 3 of 3 positions shown · non-contrast
Comparison: None.

CLINICAL DATA: Basketball injury.  Mid right foot pain.

EXAM:
RIGHT FOOT COMPLETE - 3+ VIEW

[x foot ap right]
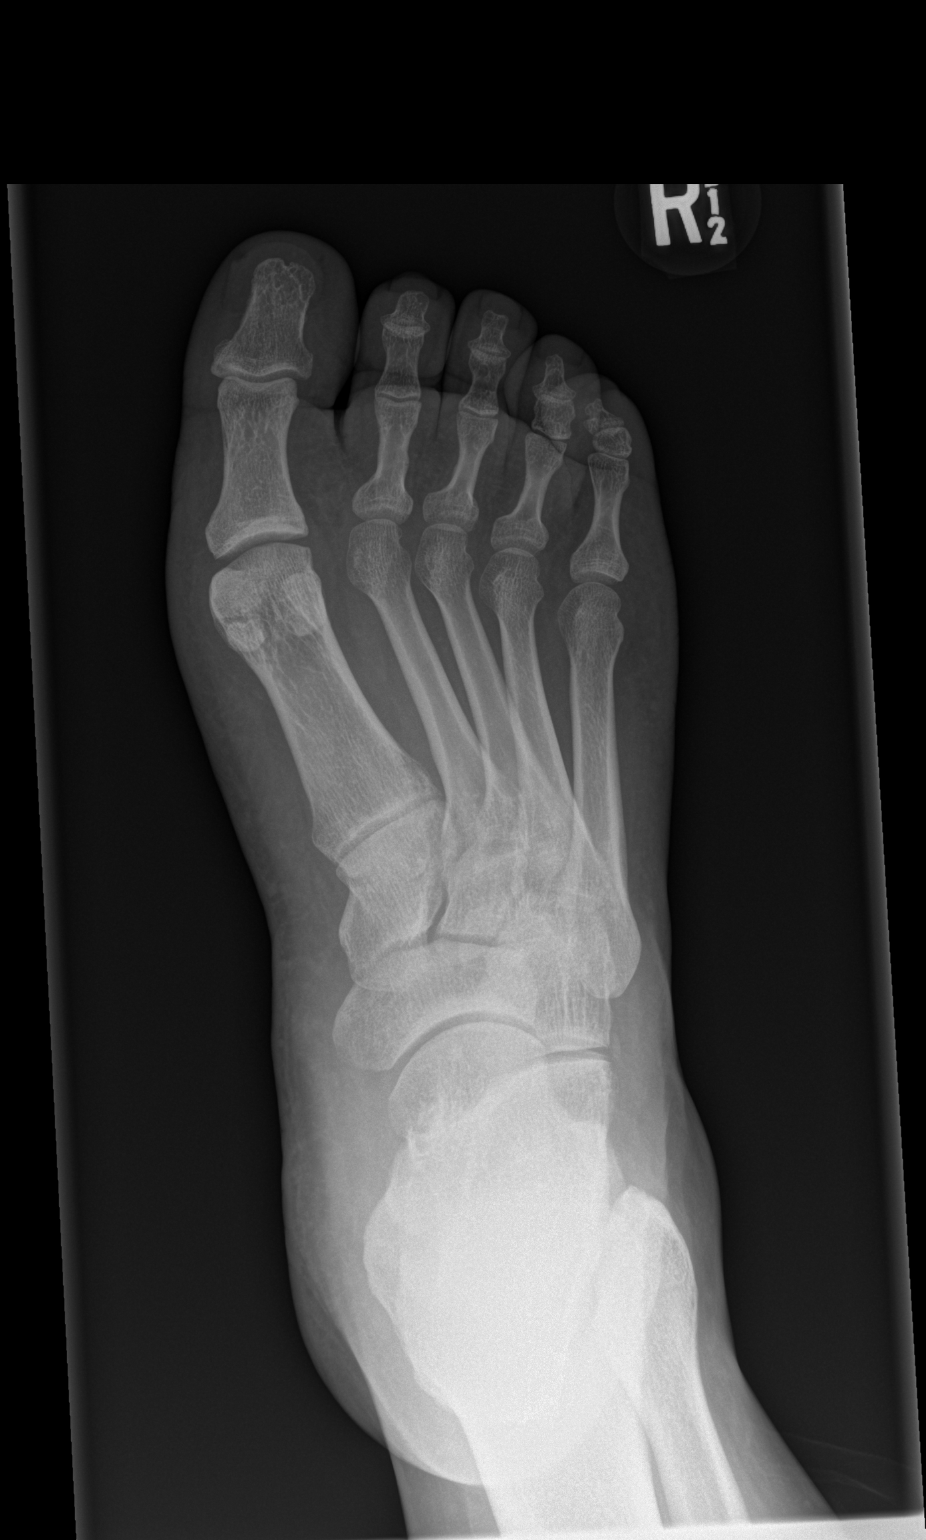

[x foot obl right]
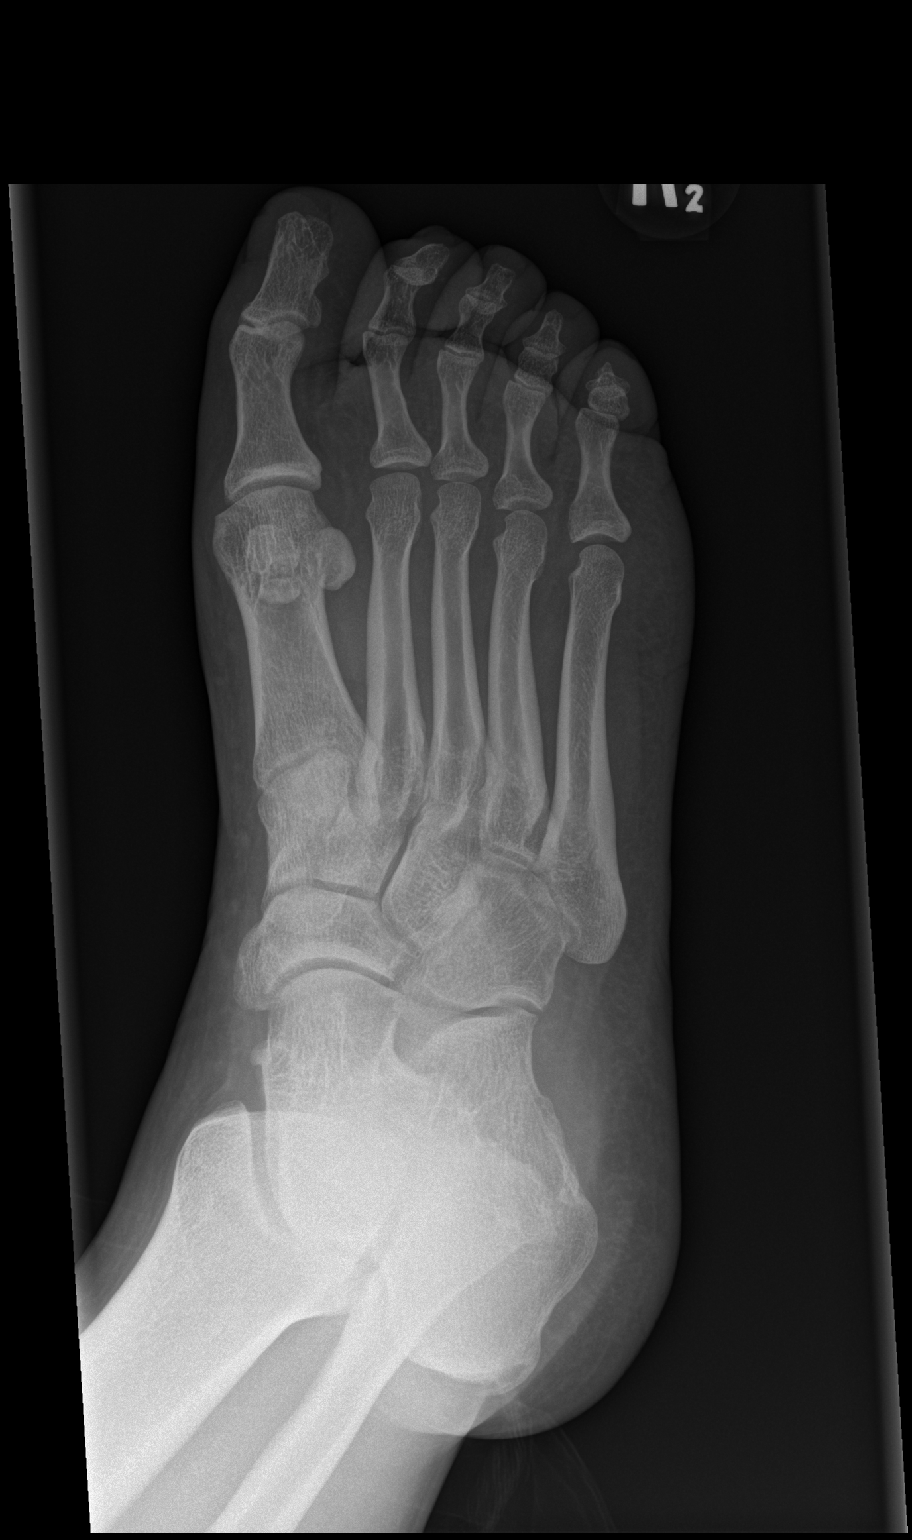

[x foot lat right]
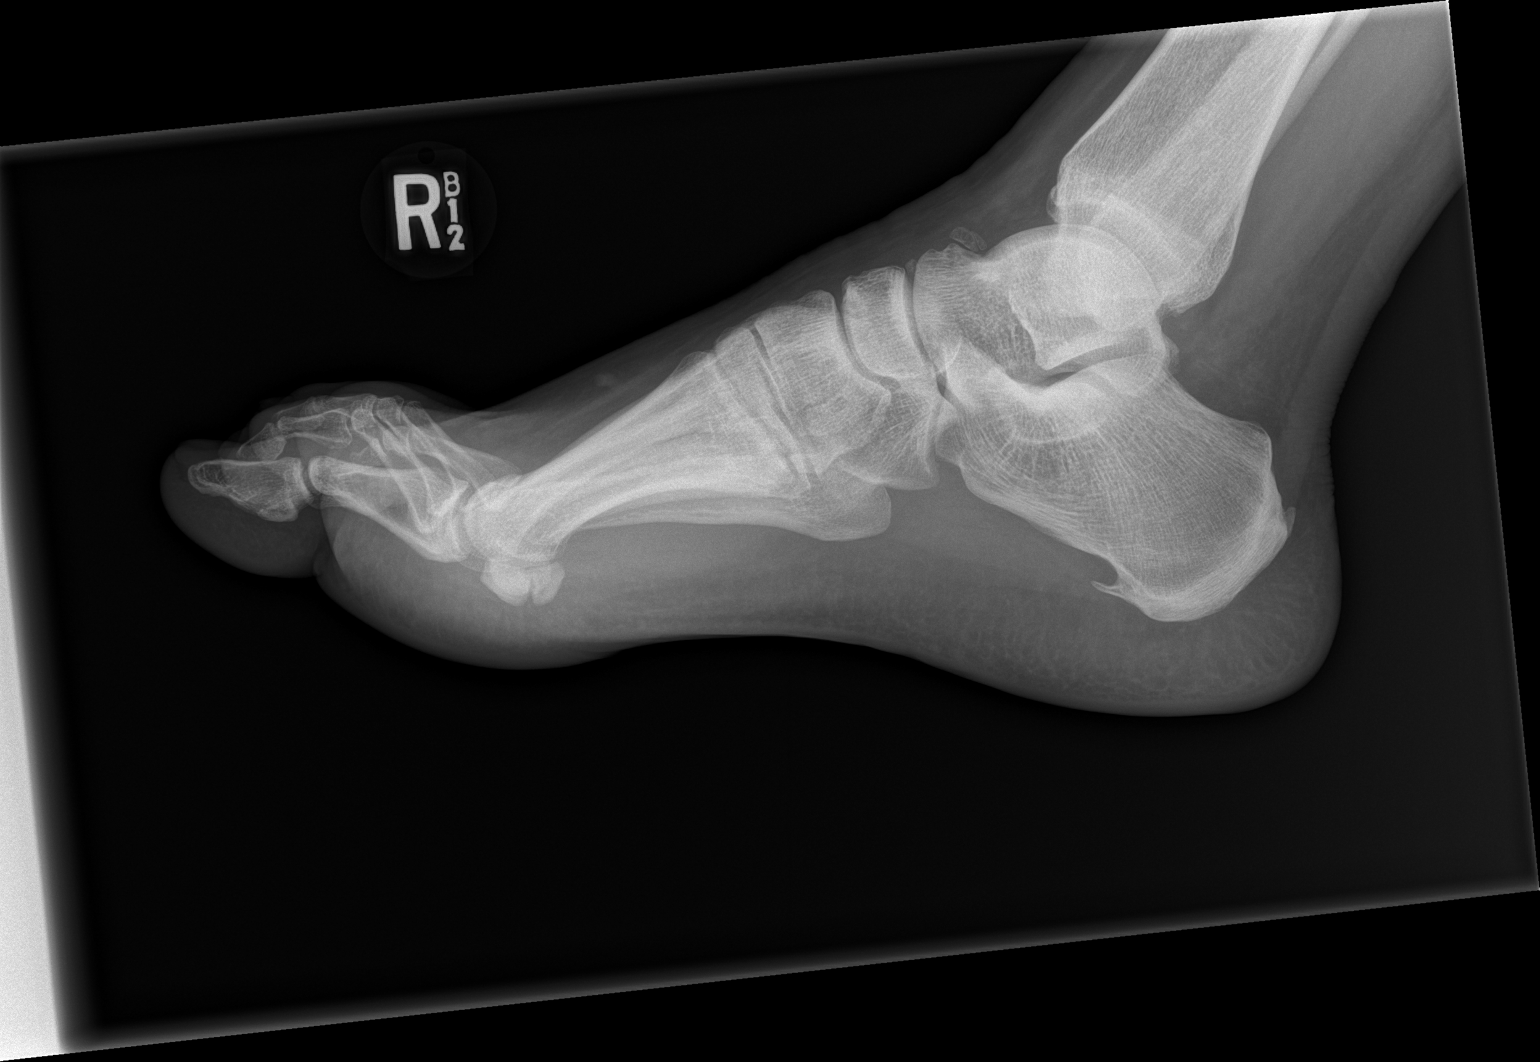

[3 of 3 positions shown; findings below may reference images not displayed]

FINDINGS: Plantar calcaneal spur. No acute bony abnormality. Specifically, no
fracture, subluxation, or dislocation.
IMPRESSION: No acute bony abnormality.
